# Patient Record
Sex: Male | Born: 2000 | Hispanic: Yes | Marital: Single | Smoking: Never smoker
Health system: Southern US, Community
[De-identification: ages and names within clinical notes are randomized; demographics above are authoritative.]

## PROBLEM LIST (undated history)

## (undated) HISTORY — PX: APPENDECTOMY: SHX54

---
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
## 2022-07-08 DIAGNOSIS — R21 Rash and other nonspecific skin eruption: Secondary | ICD-10-CM | POA: Diagnosis not present

## 2022-07-08 DIAGNOSIS — H1013 Acute atopic conjunctivitis, bilateral: Secondary | ICD-10-CM | POA: Diagnosis not present

## 2022-12-27 ENCOUNTER — Encounter: Payer: Self-pay | Admitting: Emergency Medicine

## 2022-12-27 ENCOUNTER — Other Ambulatory Visit: Payer: Self-pay

## 2022-12-27 DIAGNOSIS — R1013 Epigastric pain: Secondary | ICD-10-CM | POA: Insufficient documentation

## 2022-12-27 DIAGNOSIS — R109 Unspecified abdominal pain: Secondary | ICD-10-CM | POA: Diagnosis present

## 2022-12-27 LAB — CBC
HCT: 45.7 % (ref 39.0–52.0)
Hemoglobin: 15.5 g/dL (ref 13.0–17.0)
MCH: 30.2 pg (ref 26.0–34.0)
MCHC: 33.9 g/dL (ref 30.0–36.0)
MCV: 89.1 fL (ref 80.0–100.0)
Platelets: 167 10*3/uL (ref 150–400)
RBC: 5.13 MIL/uL (ref 4.22–5.81)
RDW: 12 % (ref 11.5–15.5)
WBC: 8.5 10*3/uL (ref 4.0–10.5)
nRBC: 0 % (ref 0.0–0.2)

## 2022-12-27 LAB — COMPREHENSIVE METABOLIC PANEL
ALT: 14 U/L (ref 0–44)
AST: 21 U/L (ref 15–41)
Albumin: 4.9 g/dL (ref 3.5–5.0)
Alkaline Phosphatase: 99 U/L (ref 38–126)
Anion gap: 10 (ref 5–15)
BUN: 14 mg/dL (ref 6–20)
CO2: 26 mmol/L (ref 22–32)
Calcium: 9 mg/dL (ref 8.9–10.3)
Chloride: 102 mmol/L (ref 98–111)
Creatinine, Ser: 0.55 mg/dL — ABNORMAL LOW (ref 0.61–1.24)
GFR, Estimated: >60 mL/min (ref >60)
Glucose, Bld: 91 mg/dL (ref 70–99)
Potassium: 3.3 mmol/L — ABNORMAL LOW (ref 3.5–5.1)
Sodium: 138 mmol/L (ref 135–145)
Total Bilirubin: 0.5 mg/dL (ref <1.2)
Total Protein: 7.8 g/dL (ref 6.5–8.1)

## 2022-12-27 LAB — LIPASE, BLOOD: Lipase: 25 U/L (ref 11–51)

## 2022-12-27 LAB — URINALYSIS, ROUTINE W REFLEX MICROSCOPIC
Bilirubin Urine: NEGATIVE
Glucose, UA: NEGATIVE mg/dL
Hgb urine dipstick: NEGATIVE
Ketones, ur: NEGATIVE mg/dL
Leukocytes,Ua: NEGATIVE
Nitrite: NEGATIVE
Protein, ur: NEGATIVE mg/dL
Specific Gravity, Urine: 1.017 (ref 1.005–1.030)
pH: 6 (ref 5.0–8.0)

## 2022-12-27 MED ORDER — ONDANSETRON 4 MG PO TBDP: 4.0000 mg | ORAL_TABLET | Freq: Once | ORAL | Status: DC | PRN

## 2022-12-27 NOTE — ED Triage Notes (Signed)
Pt c/o mid- abdominal pain described as pressure that started yesterday after having a BM. Has felt pressure since. Sts concern 2 episodes of dark bloody watery BM. No fevers/chills. No recent travel.

## 2022-12-28 ENCOUNTER — Emergency Department
Admission: EM | Admit: 2022-12-28 | Discharge: 2022-12-28 | Disposition: A | Discharge status: Home / Self Care | Payer: Self-pay | Attending: Emergency Medicine | Admitting: Emergency Medicine

## 2022-12-28 DIAGNOSIS — R1013 Epigastric pain: Secondary | ICD-10-CM

## 2022-12-28 NOTE — Discharge Instructions (Addendum)
We recommend you try taking an over-the-counter medication such as Pepcid (famotidine) according to label instructions for at least the next week to see if this helps with your abdominal discomfort.  Try following up with a primary care doctor; a representative should reach out to you soon to help you schedule a follow-up appointment.    Return to the emergency department if you develop new or worsening symptoms that concern you.

## 2022-12-28 NOTE — ED Notes (Signed)
Patient given discharge instructions including importance of establishing PCP and meds to try OTC with stated understanding. Patient stable and ambulatory with steady even gait on dispo.

## 2022-12-28 NOTE — ED Provider Notes (Signed)
[facility] Provider Note    Event Date/Time   First MD Initiated Contact with Patient 12/28/22 0128     (approximate)   History   Abdominal Pain   HPI Preston Miller is a 22 y.o. male who presents for evaluation of about 3 days of intermittent abdominal discomfort.  He said that it started after he ate and he would feel a pressure or tightness in his upper abdomen.  He describes it is not really burning, but more of a pressure.  Initially he had some nausea but that resolved and he did not vomit.  He was little bit concerned because he has had 1 or 2 bowel movements and has a little bit of blood in it which is unusual.  He has no pain with bowel movements and he has no lower abdominal pain.  No recent fever, chest pain, nor shortness of breath.     Physical Exam   Triage Vital Signs: ED Triage Vitals [12/27/22 2155]  Encounter Vitals Group     BP (!) 144/77     Systolic BP Percentile      Diastolic BP Percentile      Pulse Rate (!) 101     Resp 16     Temp 98.7 F (37.1 C)     Temp Source Oral     SpO2 100 %     Weight 65.3 kg (144 lb)     Height 1.626 m (5\' 4" )     Head Circumference      Peak Flow      Pain Score 4     Pain Loc      Pain Education      Exclude from Growth Chart     Most recent vital signs: Vitals:   12/27/22 2155 12/28/22 0132  BP: (!) 144/77 121/81  Pulse: (!) 101 94  Resp: 16 16  Temp: 98.7 F (37.1 C) 98.1 F (36.7 C)  SpO2: 100% 99%    General: Awake, no distress.  Well-appearing. CV:  Good peripheral perfusion.  Resp:  Normal effort. Speaking easily and comfortably, no accessory muscle usage nor intercostal retractions.   Abd:  No distention.  No tenderness to palpation with no rebound and no guarding. Other:  Deferred rectal exam.   ED Results / Procedures / Treatments   Labs (all labs ordered are listed, but only abnormal results are displayed) Labs Reviewed  COMPREHENSIVE METABOLIC PANEL -  Abnormal; Notable for the following components:      Result Value   Potassium 3.3 (*)    Creatinine, Ser 0.55 (*)    All other components within normal limits  URINALYSIS, ROUTINE W REFLEX MICROSCOPIC - Abnormal; Notable for the following components:   Color, Urine YELLOW (*)    APPearance CLEAR (*)    All other components within normal limits  LIPASE, BLOOD  CBC      PROCEDURES:  Critical Care performed: No  Procedures    IMPRESSION / MDM / ASSESSMENT AND PLAN / ED COURSE  I reviewed the triage vital signs and the nursing notes.                              Differential diagnosis includes, but is not limited to, acid reflux, nonspecific gastritis, biliary colic, appendicitis.  Patient's presentation is most consistent with acute presentation with potential threat to life or bodily function.  Labs/studies ordered: Urinalysis, CMP, lipase,  CBC  Interventions/Medications given:  Medications  ondansetron (ZOFRAN-ODT) disintegrating tablet 4 mg (has no administration in time range)    (Note:  hospital course my include additional interventions and/or labs/studies not listed above.)   Patient is well-appearing and in no distress with no tenderness to palpation of the abdomen at all.  His symptoms only occur after he eats and I suspect it may be acid reflux.  Given no tenderness in the epigastrium or right upper quadrant, I do not think he would benefit from an ultrasound nor does he need a CT scan.  His labs are all essentially normal other than very mild hypokalemia that is not clinically significant.  I recommended trying Pepcid and ordered a referral to primary care.  I gave my usual and customary abdominal pain return precautions and he understands and agrees with the plan.         FINAL CLINICAL IMPRESSION(S) / ED DIAGNOSES   Final diagnoses:  Epigastric pain     Rx / DC Orders   ED Discharge Orders          Ordered    Ambulatory Referral to Primary  Care (Establish Care)        12/28/22 0235             Note:  This document was prepared using Dragon voice recognition software and may include unintentional dictation errors.   Loleta Rose, MD 12/28/22 [phone]

## 2022-12-30 ENCOUNTER — Encounter: Payer: Self-pay | Admitting: Emergency Medicine

## 2023-11-10 DIAGNOSIS — R509 Fever, unspecified: Secondary | ICD-10-CM | POA: Diagnosis not present

## 2023-11-10 DIAGNOSIS — K047 Periapical abscess without sinus: Secondary | ICD-10-CM | POA: Diagnosis not present
